# Patient Record
Sex: Male | Born: 1995 | Race: White | Hispanic: No | Marital: Married | Smoking: Never smoker
Health system: Southern US, Community
[De-identification: ages and names within clinical notes are randomized; demographics above are authoritative.]

---

## 2017-06-10 ENCOUNTER — Ambulatory Visit: Payer: 59

## 2017-06-10 ENCOUNTER — Other Ambulatory Visit (HOSPITAL_BASED_OUTPATIENT_CLINIC_OR_DEPARTMENT_OTHER): Payer: Self-pay | Admitting: Family Medicine

## 2017-06-10 ENCOUNTER — Other Ambulatory Visit: Payer: Self-pay | Admitting: Family Medicine

## 2017-06-10 DIAGNOSIS — M79661 Pain in right lower leg: Secondary | ICD-10-CM

## 2019-03-01 IMAGING — US US EXTREM LOW VENOUS*R*
1 series · 13 of 24 positions shown · non-contrast
Comparison: None.

CLINICAL DATA: 22-year-old male with posterior calf pain for the
past 3 days



[Series 1: us extrem low venous*right* · 0.07mm/px · 13 of 31 slices shown]
[im 1/31]
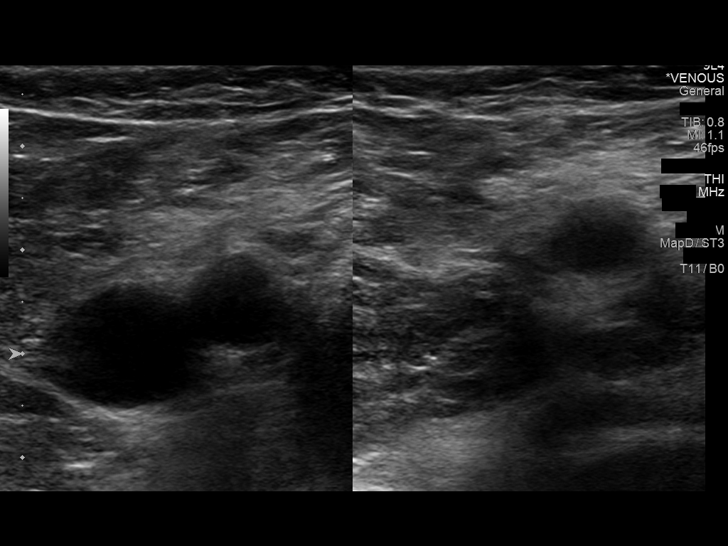
[im 3/31]
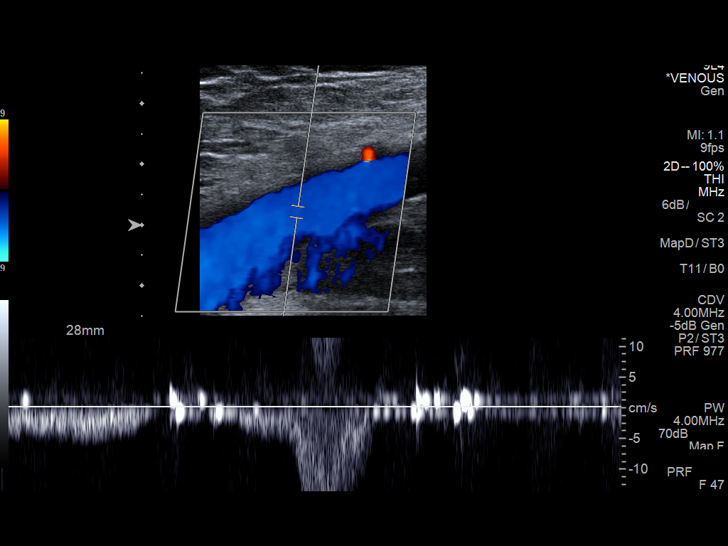
[im 6/31]
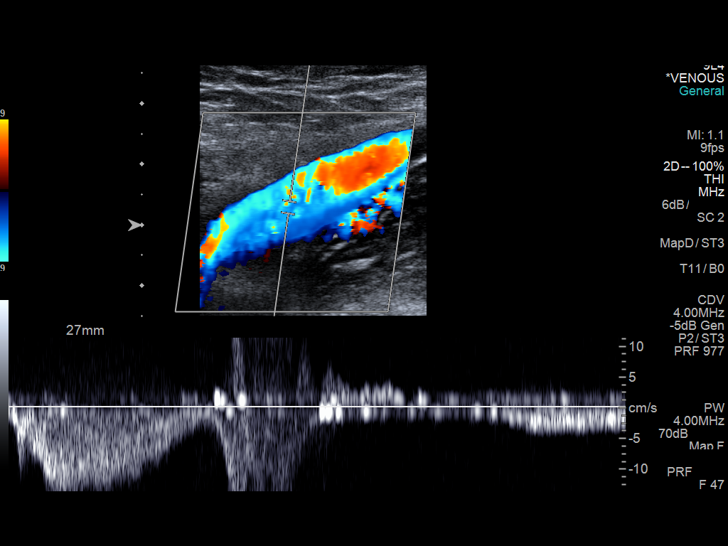
[im 8/31]
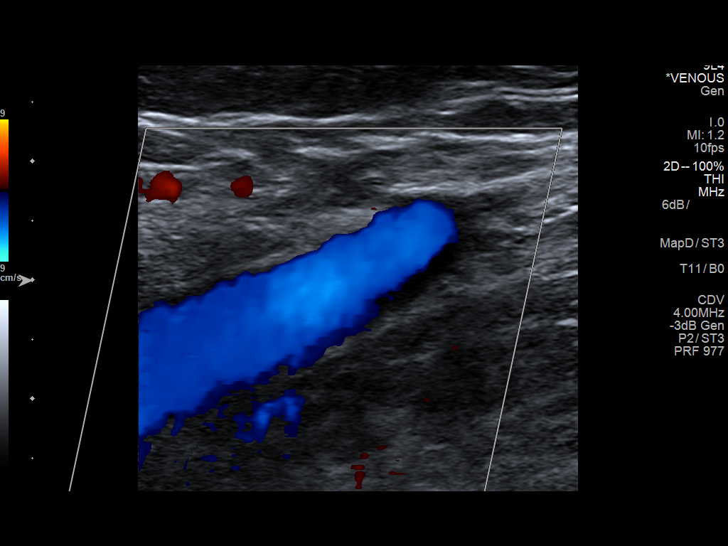
[im 11/31]
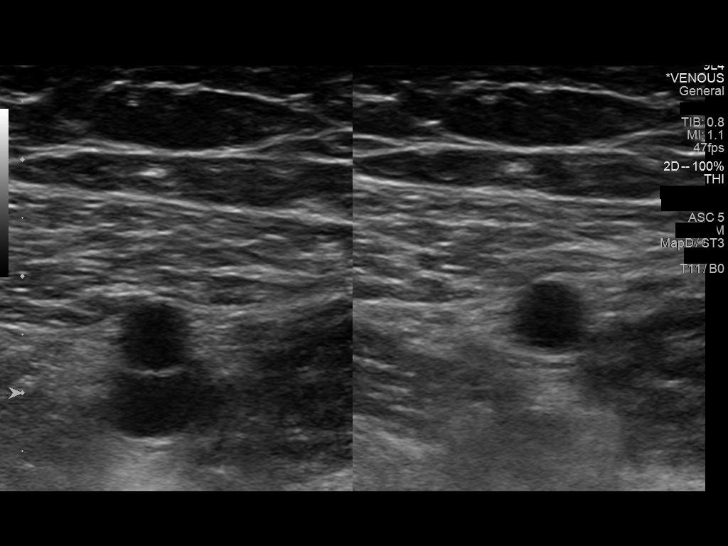
[im 14/31]
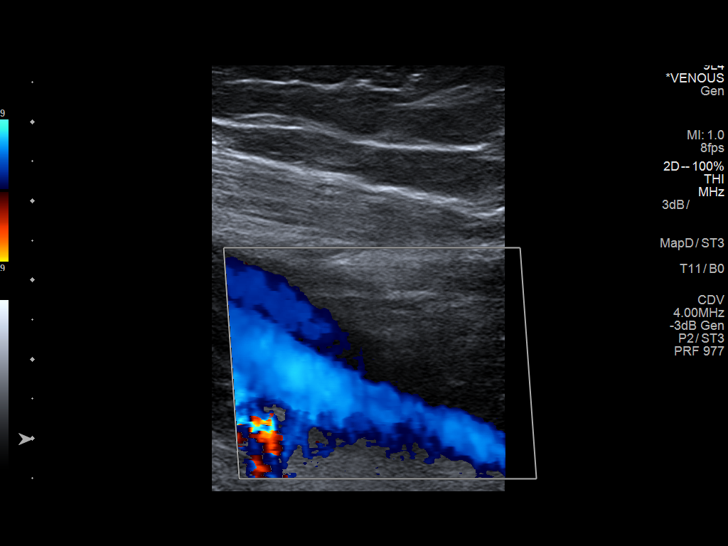
[im 16/31]
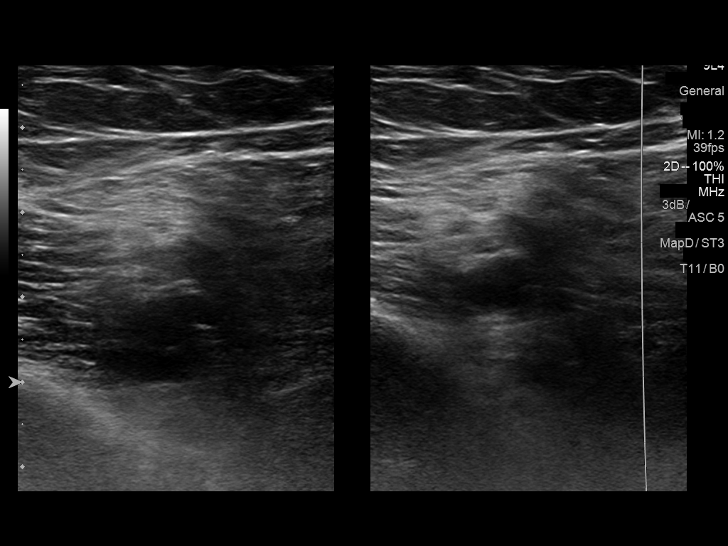
[im 17/31]
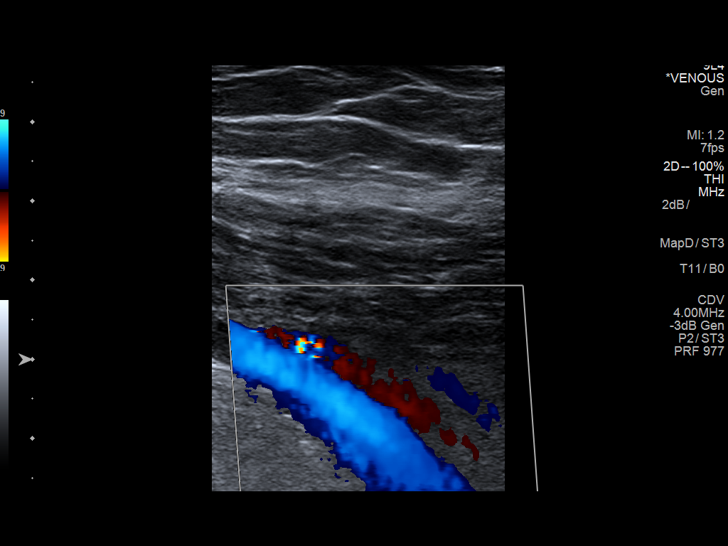
[im 20/31]
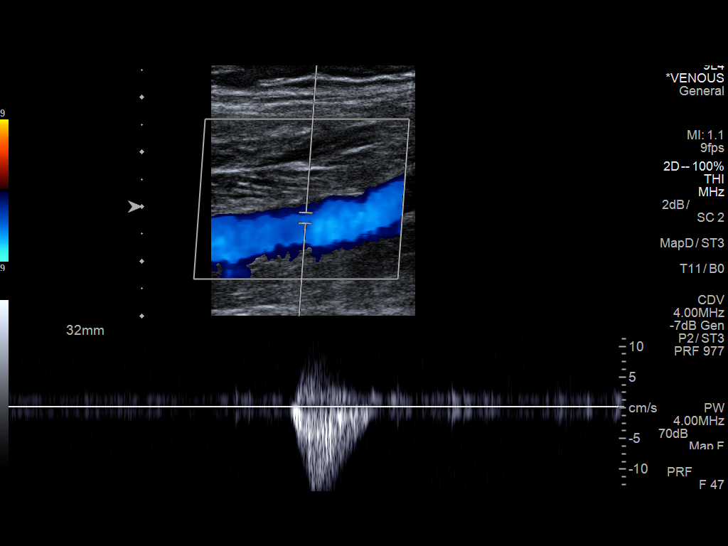
[im 23/31]
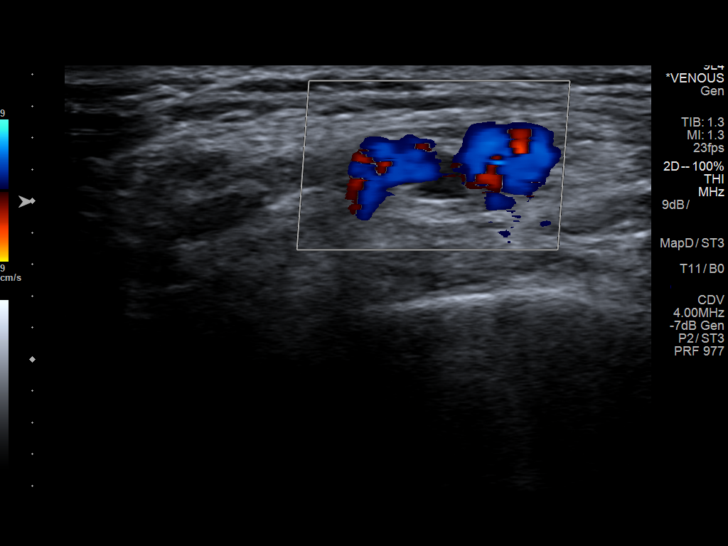
[im 25/31]
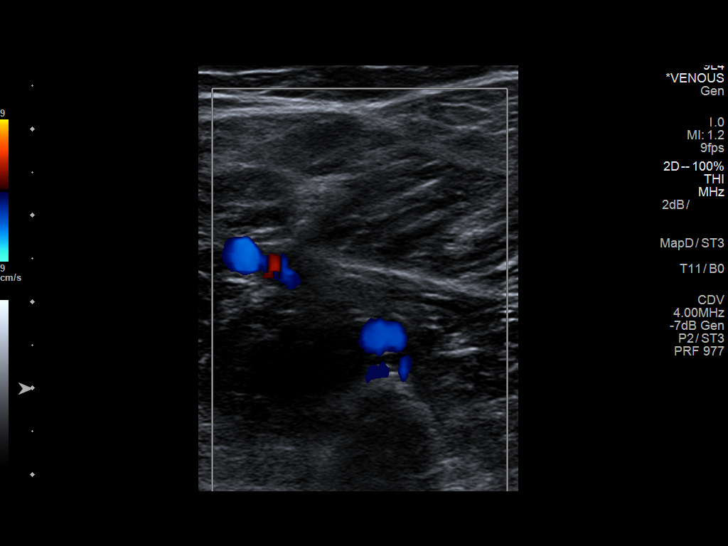
[im 28/31]
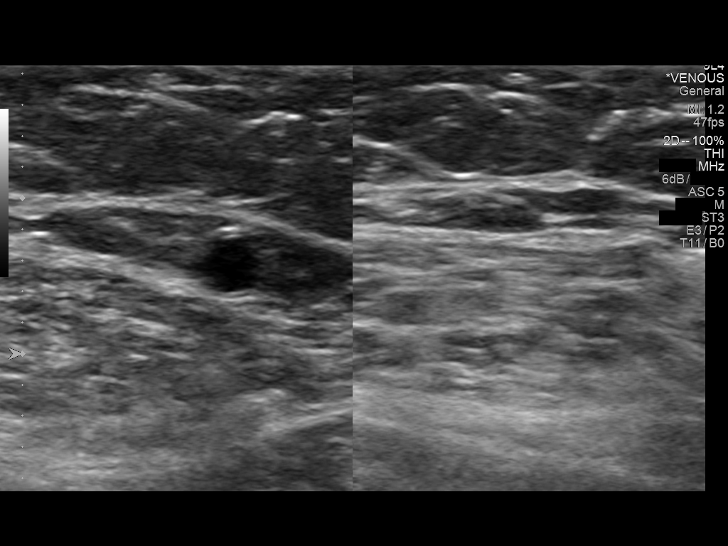
[im 31/31]
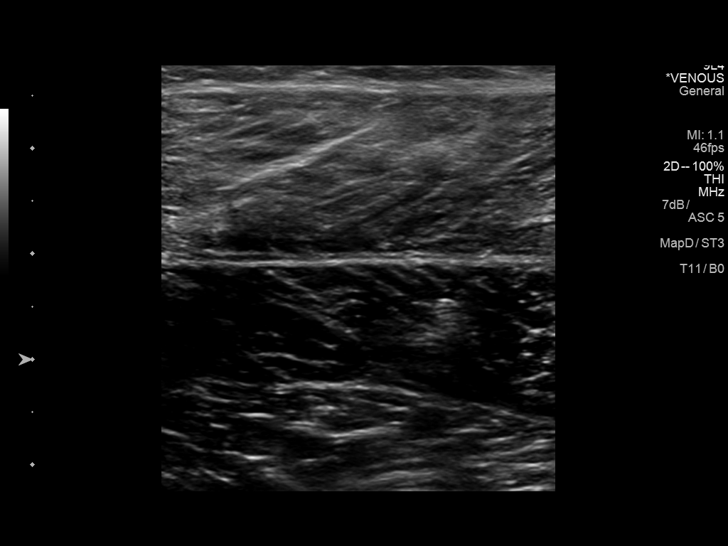

[13 of 24 positions shown; findings below may reference images not displayed]

FINDINGS: Contralateral Common Femoral Vein: Respiratory phasicity is normal
and symmetric with the symptomatic side. No evidence of thrombus.
Normal compressibility.

Common Femoral Vein: No evidence of thrombus. Normal
compressibility, respiratory phasicity and response to augmentation.

Saphenofemoral Junction: No evidence of thrombus. Normal
compressibility and flow on color Doppler imaging.

Profunda Femoral Vein: No evidence of thrombus. Normal
compressibility and flow on color Doppler imaging.

Femoral Vein: No evidence of thrombus. Normal compressibility,
respiratory phasicity and response to augmentation.

Popliteal Vein: No evidence of thrombus. Normal compressibility,
respiratory phasicity and response to augmentation.

Calf Veins: No evidence of thrombus. Normal compressibility and flow
on color Doppler imaging.

Superficial Great Saphenous Vein: No evidence of thrombus. Normal
compressibility.

Venous Reflux:  None.

Other Findings:  None.
IMPRESSION: No evidence of deep venous thrombosis.

## 2022-05-07 ENCOUNTER — Other Ambulatory Visit: Payer: Self-pay

## 2022-05-07 ENCOUNTER — Emergency Department (HOSPITAL_BASED_OUTPATIENT_CLINIC_OR_DEPARTMENT_OTHER)
Admission: EM | Admit: 2022-05-07 | Discharge: 2022-05-07 | Disposition: A | Discharge status: Home / Self Care | Payer: 59 | Attending: Emergency Medicine | Admitting: Emergency Medicine

## 2022-05-07 ENCOUNTER — Encounter (HOSPITAL_BASED_OUTPATIENT_CLINIC_OR_DEPARTMENT_OTHER): Payer: Self-pay | Admitting: Urology

## 2022-05-07 DIAGNOSIS — R1013 Epigastric pain: Secondary | ICD-10-CM | POA: Insufficient documentation

## 2022-05-07 DIAGNOSIS — R112 Nausea with vomiting, unspecified: Secondary | ICD-10-CM | POA: Insufficient documentation

## 2022-05-07 LAB — COMPREHENSIVE METABOLIC PANEL
ALT: 41 U/L (ref 0–44)
AST: 34 U/L (ref 15–41)
Albumin: 4.6 g/dL (ref 3.5–5.0)
Alkaline Phosphatase: 47 U/L (ref 38–126)
Anion gap: 15 (ref 5–15)
BUN: 15 mg/dL (ref 6–20)
CO2: 26 mmol/L (ref 22–32)
Calcium: 9.4 mg/dL (ref 8.9–10.3)
Chloride: 96 mmol/L — ABNORMAL LOW (ref 98–111)
Creatinine, Ser: 0.78 mg/dL (ref 0.61–1.24)
GFR, Estimated: >60 mL/min (ref >60)
Glucose, Bld: 144 mg/dL — ABNORMAL HIGH (ref 70–99)
Potassium: 3.9 mmol/L (ref 3.5–5.1)
Sodium: 137 mmol/L (ref 135–145)
Total Bilirubin: 0.7 mg/dL (ref 0.3–1.2)
Total Protein: 7.1 g/dL (ref 6.5–8.1)

## 2022-05-07 LAB — LIPASE, BLOOD: Lipase: 98 U/L — ABNORMAL HIGH (ref 11–51)

## 2022-05-07 LAB — CBC WITH DIFFERENTIAL/PLATELET
Abs Immature Granulocytes: 0.06 10*3/uL (ref 0.00–0.07)
Basophils Absolute: 0.1 10*3/uL (ref 0.0–0.1)
Basophils Relative: 0 %
Eosinophils Absolute: 0 10*3/uL (ref 0.0–0.5)
Eosinophils Relative: 0 %
HCT: 42.8 % (ref 39.0–52.0)
Hemoglobin: 15.6 g/dL (ref 13.0–17.0)
Immature Granulocytes: 1 %
Lymphocytes Relative: 12 %
Lymphs Abs: 1.5 10*3/uL (ref 0.7–4.0)
MCH: 34.4 pg — ABNORMAL HIGH (ref 26.0–34.0)
MCHC: 36.4 g/dL — ABNORMAL HIGH (ref 30.0–36.0)
MCV: 94.3 fL (ref 80.0–100.0)
Monocytes Absolute: 0.6 10*3/uL (ref 0.1–1.0)
Monocytes Relative: 4 %
Neutro Abs: 10.9 10*3/uL — ABNORMAL HIGH (ref 1.7–7.7)
Neutrophils Relative %: 83 %
Platelets: 263 10*3/uL (ref 150–400)
RBC: 4.54 MIL/uL (ref 4.22–5.81)
RDW: 11.7 % (ref 11.5–15.5)
WBC: 13.1 10*3/uL — ABNORMAL HIGH (ref 4.0–10.5)
nRBC: 0 % (ref 0.0–0.2)

## 2022-05-07 MED ORDER — ONDANSETRON HCL 4 MG/2ML IJ SOLN
4.0000 mg | Freq: Once | INTRAMUSCULAR | Status: AC
  Administered 2022-05-07: 4 mg via INTRAVENOUS
  Filled 2022-05-07: qty 2

## 2022-05-07 MED ORDER — MORPHINE SULFATE (PF) 4 MG/ML IV SOLN
4.0000 mg | Freq: Once | INTRAVENOUS | Status: AC
  Administered 2022-05-07: 4 mg via INTRAVENOUS
  Filled 2022-05-07: qty 1

## 2022-05-07 MED ORDER — ALUM & MAG HYDROXIDE-SIMETH 200-200-20 MG/5ML PO SUSP
30.0000 mL | Freq: Once | ORAL | Status: AC
Start: 2022-05-07 — End: 2022-05-07
  Administered 2022-05-07: 30 mL via ORAL
  Filled 2022-05-07: qty 30

## 2022-05-07 MED ORDER — ONDANSETRON 4 MG PO TBDP: ORAL_TABLET | ORAL | 0 refills | Status: DC

## 2022-05-07 NOTE — Discharge Instructions (Addendum)
Try pepcid or tagamet up to twice a day.  Try to avoid things that may make this worse, most commonly these are spicy foods tomato based products fatty foods chocolate and peppermint.  Alcohol and tobacco can also make this worse.  Return to the emergency department for sudden worsening pain fever or inability to eat or drink.  

## 2022-05-07 NOTE — ED Triage Notes (Signed)
Pt states LUQ pain that started last night and got worse this am  Denies N/V/D now, but did vomit up morning vitamins that he normally takes  Denies fever

## 2022-05-07 NOTE — ED Provider Notes (Signed)
[place] EMERGENCY DEPARTMENT AT [facility] Provider Note   CSN: [id number] Arrival date & time: 05/07/22  1114     History  Chief Complaint  Patient presents with   Abdominal Pain    Marcus Summers is a 27 y.o. male.  27 yo M with a chief complaint of nausea and vomiting.  Said he had some upper abdominal pain after eating something yesterday felt out of the restaurant so did not finish it and then ate the rest of it when he got home and felt worse.  Woke up this morning went to take his vitamins and then ended up throwing up.  No fevers.  No diarrhea.  No known sick contacts.   Abdominal Pain      Home Medications Prior to Admission medications   Medication Sig Start Date End Date Taking? Authorizing Provider  ondansetron (ZOFRAN-ODT) 4 MG disintegrating tablet 4mg  ODT q4 hours prn nausea/vomit 05/07/22  Yes Melene Plan, DO      Allergies    Patient has no known allergies.    Review of Systems   Review of Systems  Gastrointestinal:  Positive for abdominal pain.    Physical Exam Updated Vital Signs BP (!) 142/93 (BP Location: Right Arm)   Pulse 69   Temp 98.2 F (36.8 C) (Oral)   Resp 18   Ht 5\' 10"  (1.778 m)   Wt 88.5 kg   SpO2 99%   BMI 27.98 kg/m  Physical Exam Vitals and nursing note reviewed.  Constitutional:      Appearance: He is well-developed.  HENT:     Head: Normocephalic and atraumatic.  Eyes:     Pupils: Pupils are equal, round, and reactive to light.  Neck:     Vascular: No JVD.  Cardiovascular:     Rate and Rhythm: Normal rate and regular rhythm.     Heart sounds: No murmur heard.    No friction rub. No gallop.  Pulmonary:     Effort: No respiratory distress.     Breath sounds: No wheezing.  Abdominal:     General: There is no distension.     Tenderness: There is abdominal tenderness. There is no guarding or rebound.     Comments: Diffuse upper abdominal discomfort.  Negative Murphy sign.  Musculoskeletal:         General: Normal range of motion.     Cervical back: Normal range of motion and neck supple.  Skin:    Coloration: Skin is not pale.     Findings: No rash.  Neurological:     Mental Status: He is alert and oriented to person, place, and time.  Psychiatric:        Behavior: Behavior normal.     ED Results / Procedures / Treatments   Labs (all labs ordered are listed, but only abnormal results are displayed) Labs Reviewed  CBC WITH DIFFERENTIAL/PLATELET - Abnormal; Notable for the following components:      Result Value   WBC 13.1 (*)    MCH 34.4 (*)    MCHC 36.4 (*)    Neutro Abs 10.9 (*)    All other components within normal limits  COMPREHENSIVE METABOLIC PANEL - Abnormal; Notable for the following components:   Chloride 96 (*)    Glucose, Bld 144 (*)    All other components within normal limits  LIPASE, BLOOD - Abnormal; Notable for the following components:   Lipase 98 (*)    All other components within normal limits  EKG None  Radiology No results found.  Procedures Procedures    Medications Ordered in ED Medications  alum & mag hydroxide-simeth (MAALOX/MYLANTA) 200-200-20 MG/5ML suspension 30 mL (30 mLs Oral Given 05/07/22 1153)  morphine (PF) 4 MG/ML injection 4 mg (4 mg Intravenous Given 05/07/22 1153)  ondansetron (ZOFRAN) injection 4 mg (4 mg Intravenous Given 05/07/22 1153)    ED Course/ Medical Decision Making/ A&P                             Medical Decision Making Amount and/or Complexity of Data Reviewed Labs: ordered.  Risk OTC drugs. Prescription drug management.   27 yo M with a chief complaint of upper abdominal discomfort.  Started while eating dinner last night.  Had an episode of emesis this morning.  Upper abdominal pain for me on exam.  Blood work to assess for pancreatitis hepatitis.  Treatment and nausea.  GI cocktail.  Reassess.  Patient is doing a bit better on repeat assessment.  His LFTs are unremarkable.  His lipase is in an  indeterminate range.  He has mild leukocytosis.  I discussed the results with him.  At this point we will attempt to discharge home.  Will have him follow-up with GI in the office.  1:58 PM:  I have discussed the diagnosis/risks/treatment options with the patient.  Evaluation and diagnostic testing in the emergency department does not suggest an emergent condition requiring admission or immediate intervention beyond what has been performed at this time.  They will follow up with PCP. We also discussed returning to the ED immediately if new or worsening sx occur. We discussed the sx which are most concerning (e.g., sudden worsening pain, fever, inability to tolerate by mouth) that necessitate immediate return. Medications administered to the patient during their visit and any new prescriptions provided to the patient are listed below.  Medications given during this visit Medications  alum & mag hydroxide-simeth (MAALOX/MYLANTA) 200-200-20 MG/5ML suspension 30 mL (30 mLs Oral Given 05/07/22 1153)  morphine (PF) 4 MG/ML injection 4 mg (4 mg Intravenous Given 05/07/22 1153)  ondansetron (ZOFRAN) injection 4 mg (4 mg Intravenous Given 05/07/22 1153)     The patient appears reasonably screen and/or stabilized for discharge and I doubt any other medical condition or other [facility] requiring further screening, evaluation, or treatment in the ED at this time prior to discharge.          Final Clinical Impression(s) / ED Diagnoses Final diagnoses:  Epigastric pain    Rx / DC Orders ED Discharge Orders          Ordered    ondansetron (ZOFRAN-ODT) 4 MG disintegrating tablet        05/07/22 1308              Melene Plan, DO 05/07/22 1358

## 2022-08-13 ENCOUNTER — Emergency Department (HOSPITAL_BASED_OUTPATIENT_CLINIC_OR_DEPARTMENT_OTHER)
Admission: EM | Admit: 2022-08-13 | Discharge: 2022-08-14 | Disposition: A | Discharge status: Home / Self Care | Payer: Self-pay | Attending: Emergency Medicine | Admitting: Emergency Medicine

## 2022-08-13 ENCOUNTER — Emergency Department (HOSPITAL_BASED_OUTPATIENT_CLINIC_OR_DEPARTMENT_OTHER): Payer: Self-pay

## 2022-08-13 ENCOUNTER — Encounter (HOSPITAL_BASED_OUTPATIENT_CLINIC_OR_DEPARTMENT_OTHER): Payer: Self-pay

## 2022-08-13 ENCOUNTER — Other Ambulatory Visit: Payer: Self-pay

## 2022-08-13 DIAGNOSIS — K852 Alcohol induced acute pancreatitis without necrosis or infection: Secondary | ICD-10-CM | POA: Insufficient documentation

## 2022-08-13 DIAGNOSIS — R7989 Other specified abnormal findings of blood chemistry: Secondary | ICD-10-CM | POA: Insufficient documentation

## 2022-08-13 DIAGNOSIS — D72829 Elevated white blood cell count, unspecified: Secondary | ICD-10-CM | POA: Insufficient documentation

## 2022-08-13 LAB — COMPREHENSIVE METABOLIC PANEL
ALT: 34 U/L (ref 0–44)
AST: 24 U/L (ref 15–41)
Albumin: 4.3 g/dL (ref 3.5–5.0)
Alkaline Phosphatase: 52 U/L (ref 38–126)
Anion gap: 14 (ref 5–15)
BUN: 10 mg/dL (ref 6–20)
CO2: 24 mmol/L (ref 22–32)
Calcium: 9.1 mg/dL (ref 8.9–10.3)
Chloride: 95 mmol/L — ABNORMAL LOW (ref 98–111)
Creatinine, Ser: 0.82 mg/dL (ref 0.61–1.24)
GFR, Estimated: >60 mL/min (ref >60)
Glucose, Bld: 144 mg/dL — ABNORMAL HIGH (ref 70–99)
Potassium: 3.5 mmol/L (ref 3.5–5.1)
Sodium: 133 mmol/L — ABNORMAL LOW (ref 135–145)
Total Bilirubin: 1.6 mg/dL — ABNORMAL HIGH (ref 0.3–1.2)
Total Protein: 8 g/dL (ref 6.5–8.1)

## 2022-08-13 LAB — LIPASE, BLOOD: Lipase: 162 U/L — ABNORMAL HIGH (ref 11–51)

## 2022-08-13 LAB — URINALYSIS, ROUTINE W REFLEX MICROSCOPIC
Bilirubin Urine: NEGATIVE
Glucose, UA: NEGATIVE mg/dL
Ketones, ur: NEGATIVE mg/dL
Leukocytes,Ua: NEGATIVE
Nitrite: POSITIVE — AB
Protein, ur: 100 mg/dL — AB
Specific Gravity, Urine: >=1.03 (ref 1.005–1.030)
pH: 6 (ref 5.0–8.0)

## 2022-08-13 LAB — CBC
HCT: 48.5 % (ref 39.0–52.0)
Hemoglobin: 17.5 g/dL — ABNORMAL HIGH (ref 13.0–17.0)
MCH: 33.6 pg (ref 26.0–34.0)
MCHC: 36.1 g/dL — ABNORMAL HIGH (ref 30.0–36.0)
MCV: 93.1 fL (ref 80.0–100.0)
Platelets: 261 10*3/uL (ref 150–400)
RBC: 5.21 MIL/uL (ref 4.22–5.81)
RDW: 11.9 % (ref 11.5–15.5)
WBC: 19 10*3/uL — ABNORMAL HIGH (ref 4.0–10.5)
nRBC: 0 % (ref 0.0–0.2)

## 2022-08-13 LAB — URINALYSIS, MICROSCOPIC (REFLEX)

## 2022-08-13 MED ORDER — SODIUM CHLORIDE 0.9 % IV SOLN
2.0000 g | Freq: Once | INTRAVENOUS | Status: AC
  Administered 2022-08-13: 2 g via INTRAVENOUS
  Filled 2022-08-13: qty 20

## 2022-08-13 MED ORDER — IBUPROFEN 800 MG PO TABS
800.0000 mg | ORAL_TABLET | Freq: Once | ORAL | Status: AC
  Administered 2022-08-13: 800 mg via ORAL
  Filled 2022-08-13: qty 1

## 2022-08-13 MED ORDER — MORPHINE SULFATE (PF) 2 MG/ML IV SOLN
2.0000 mg | Freq: Once | INTRAVENOUS | Status: AC
  Administered 2022-08-13: 2 mg via INTRAVENOUS
  Filled 2022-08-13: qty 1

## 2022-08-13 MED ORDER — KETOROLAC TROMETHAMINE 15 MG/ML IJ SOLN: 30.0000 mg | Freq: Once | INTRAMUSCULAR | Status: DC

## 2022-08-13 MED ORDER — SODIUM CHLORIDE 0.9 % IV BOLUS
1000.0000 mL | Freq: Once | INTRAVENOUS | Status: AC
  Administered 2022-08-13: 1000 mL via INTRAVENOUS

## 2022-08-13 MED ORDER — IOHEXOL 300 MG/ML  SOLN
100.0000 mL | Freq: Once | INTRAMUSCULAR | Status: AC | PRN
  Administered 2022-08-13: 100 mL via INTRAVENOUS

## 2022-08-13 NOTE — ED Provider Notes (Signed)
[facility]PT [facility] Emergency Department Provider Note MRN:  [id number]  Arrival date & time: 08/14/22     Chief Complaint   Abdominal Pain   History of Present Illness   Marcus Summers is a 27 y.o. year-old male with no pertinent past medical history presenting to the ED with chief complaint of abdominal pain.  Left lower quadrant, left flank pain for the past 24 hours.  Associated with fever.  Unable to get a comfortable position.  Pain seems to have started after a week of binge drinking while he was on vacation.  No dysuria.  Review of Systems  A thorough review of systems was obtained and all systems are negative except as noted in the HPI and PMH.   Patient's Health History   History reviewed. No pertinent past medical history.  History reviewed. No pertinent surgical history.  History reviewed. No pertinent family history.  Social History   Socioeconomic History   Marital status: Married    Spouse name: Not on file   Number of children: Not on file   Years of education: Not on file   Highest education level: Not on file  Occupational History   Not on file  Tobacco Use   Smoking status: Never   Smokeless tobacco: Never  Substance and Sexual Activity   Alcohol use: Yes    Comment: occ, last on satuday   Drug use: Never   Sexual activity: Not on file  Other Topics Concern   Not on file  Social History Narrative   Not on file   Social Determinants of Health   Financial Resource Strain: Not on file  Food Insecurity: Not on file  Transportation Needs: Not on file  Physical Activity: Not on file  Stress: Not on file  Social Connections: Not on file  Intimate Partner Violence: Not on file     Physical Exam   Vitals:   08/13/22 2330 08/13/22 2331  BP: (!) 151/102   Pulse: (!) 103   Resp: 17 17  Temp:    SpO2: 96%     CONSTITUTIONAL: Well-appearing, NAD NEURO/PSYCH:  Alert and oriented x 3, no focal deficits EYES:  eyes equal and  reactive ENT/NECK:  no LAD, no JVD CARDIO: Regular rate, well-perfused, normal S1 and S2 PULM:  CTAB no wheezing or rhonchi GI/GU:  non-distended, left lower quadrant tenderness to palpation MSK/SPINE:  No gross deformities, no edema SKIN:  no rash, atraumatic   *Additional and/or pertinent findings included in MDM below  Diagnostic and Interventional Summary    EKG Interpretation Date/Time:    Ventricular Rate:    PR Interval:    QRS Duration:    QT Interval:    QTC Calculation:   R Axis:      Text Interpretation:         Labs Reviewed  LIPASE, BLOOD - Abnormal; Notable for the following components:      Result Value   Lipase 162 (*)    All other components within normal limits  COMPREHENSIVE METABOLIC PANEL - Abnormal; Notable for the following components:   Sodium 133 (*)    Chloride 95 (*)    Glucose, Bld 144 (*)    Total Bilirubin 1.6 (*)    All other components within normal limits  CBC - Abnormal; Notable for the following components:   WBC 19.0 (*)    Hemoglobin 17.5 (*)    MCHC 36.1 (*)    All other components within normal limits  URINALYSIS, ROUTINE W REFLEX MICROSCOPIC - Abnormal; Notable for the following components:   Color, Urine AMBER (*)    Hgb urine dipstick TRACE (*)    Protein, ur 100 (*)    Nitrite POSITIVE (*)    All other components within normal limits  URINALYSIS, MICROSCOPIC (REFLEX) - Abnormal; Notable for the following components:   Bacteria, UA FEW (*)    All other components within normal limits    CT ABDOMEN PELVIS W CONTRAST  Final Result      Medications  ibuprofen (ADVIL) tablet 800 mg (800 mg Oral Given 08/13/22 2221)  iohexol (OMNIPAQUE) 300 MG/ML solution 100 mL (100 mLs Intravenous Contrast Given 08/13/22 2259)  morphine (PF) 2 MG/ML injection 2 mg (2 mg Intravenous Given 08/13/22 2326)  cefTRIAXone (ROCEPHIN) 2 g in sodium chloride 0.9 % 100 mL IVPB (2 g Intravenous New Bag/Given 08/13/22 2329)  sodium chloride 0.9 %  bolus 1,000 mL (1,000 mLs Intravenous New Bag/Given 08/13/22 2326)  morphine (PF) 2 MG/ML injection 2 mg (2 mg Intravenous Given 08/14/22 0021)     Procedures  /  Critical Care Procedures  ED Course and Medical Decision Making  Initial Impression and Ddx Patient arrives febrile and tachycardic with focal abdominal pain in the left lower quadrant.  Differential diagnosis includes diverticulitis, colitis, pyelonephritis, kidney stone.  With recent binge drinking and also considering gastric ulcer with perforation alcoholic pancreatitis.  Past medical/surgical history that increases complexity of ED encounter: None  Interpretation of Diagnostics I personally reviewed the laboratory assessment and my interpretation is as follows: Leukocytosis, elevated lipase, hemoconcentration noted, nitrate positive urine.  CT confirms acute uncomplicated pancreatitis.  Patient Reassessment and Ultimate Disposition/Management     Patient feeling a lot better.  Vital signs are normal, no acute distress, soft abdomen with no rebound guarding or rigidity.  Discussed management options of his pancreatitis, admission offered, patient comfortable going home with dietary restriction, symptomatic control.  Strict return precautions.  Question concomitant UTI versus fever due to the pancreatitis.  Will cover with Keflex.  Patient management required discussion with the following services or consulting groups:  None  Complexity of Problems Addressed Acute illness or injury that poses threat of life of bodily function  Additional Data Reviewed and Analyzed Further history obtained from: None  Additional Factors Impacting ED Encounter Risk Prescriptions, Use of parenteral controlled substances, and Consideration of hospitalization  Elmer Sow. Pilar Plate, MD [facility] Emergency Medicine [facility] [email or web address]  Final Clinical Impressions(s) / ED Diagnoses     ICD-10-CM   1.  Alcohol-induced acute pancreatitis, unspecified complication status  K85.20       ED Discharge Orders          Ordered    ondansetron (ZOFRAN-ODT) 4 MG disintegrating tablet  Every 8 hours PRN        08/14/22 0024    naproxen (NAPROSYN) 500 MG tablet  2 times daily        08/14/22 0024    oxyCODONE (ROXICODONE) 5 MG immediate release tablet  Every 4 hours PRN        08/14/22 0024    cephALEXin (KEFLEX) 500 MG capsule  3 times daily        08/14/22 0024             Discharge Instructions Discussed with and Provided to Patient:     Discharge Instructions      You were evaluated in the Emergency Department and after careful evaluation, we did  not find any emergent condition requiring admission or further testing in the hospital.  Your exam/testing today is overall reassuring.  Symptoms seem to be due to pancreatitis.  Take the Keflex antibiotic as directed.  Use the Zofran as needed for nausea.  Use the Naprosyn twice daily for pain.  Use the oxycodone as needed for more significant pain.  Will be important to restrict your diet to clear liquids for the next 2 days and after that slowly advance your diet as we discussed.  Plenty of fluids.  Please return to the Emergency Department if you experience any worsening of your condition.   Thank you for allowing [place] to be a part of your care.       Sabas Sous, MD 08/14/22 [phone]

## 2022-08-13 NOTE — ED Triage Notes (Signed)
Pt LLQ pain that started Sunday morning. Pt denies N/V/D.

## 2022-08-14 MED ORDER — ONDANSETRON 4 MG PO TBDP: 4.0000 mg | ORAL_TABLET | Freq: Three times a day (TID) | ORAL | 0 refills | Status: AC | PRN

## 2022-08-14 MED ORDER — OXYCODONE HCL 5 MG PO TABS: 5.0000 mg | ORAL_TABLET | ORAL | 0 refills | Status: AC | PRN

## 2022-08-14 MED ORDER — NAPROXEN 500 MG PO TABS: 500.0000 mg | ORAL_TABLET | Freq: Two times a day (BID) | ORAL | 0 refills | Status: AC

## 2022-08-14 MED ORDER — CEPHALEXIN 500 MG PO CAPS: 500.0000 mg | ORAL_CAPSULE | Freq: Three times a day (TID) | ORAL | 0 refills | Status: AC

## 2022-08-14 MED ORDER — MORPHINE SULFATE (PF) 2 MG/ML IV SOLN
2.0000 mg | Freq: Once | INTRAVENOUS | Status: AC
  Administered 2022-08-14: 2 mg via INTRAVENOUS
  Filled 2022-08-14: qty 1

## 2022-08-14 NOTE — Discharge Instructions (Signed)
You were evaluated in the Emergency Department and after careful evaluation, we did not find any emergent condition requiring admission or further testing in the hospital.  Your exam/testing today is overall reassuring.  Symptoms seem to be due to pancreatitis.  Take the Keflex antibiotic as directed.  Use the Zofran as needed for nausea.  Use the Naprosyn twice daily for pain.  Use the oxycodone as needed for more significant pain.  Will be important to restrict your diet to clear liquids for the next 2 days and after that slowly advance your diet as we discussed.  Plenty of fluids.  Please return to the Emergency Department if you experience any worsening of your condition.   Thank you for allowing [place] to be a part of your care.

## 2022-12-14 ENCOUNTER — Emergency Department (HOSPITAL_BASED_OUTPATIENT_CLINIC_OR_DEPARTMENT_OTHER)
Admission: EM | Admit: 2022-12-14 | Discharge: 2022-12-15 | Disposition: A | Discharge status: Home / Self Care | Payer: Self-pay | Attending: Emergency Medicine | Admitting: Emergency Medicine

## 2022-12-14 ENCOUNTER — Other Ambulatory Visit: Payer: Self-pay

## 2022-12-14 ENCOUNTER — Emergency Department (HOSPITAL_BASED_OUTPATIENT_CLINIC_OR_DEPARTMENT_OTHER): Payer: Self-pay

## 2022-12-14 ENCOUNTER — Encounter (HOSPITAL_BASED_OUTPATIENT_CLINIC_OR_DEPARTMENT_OTHER): Payer: Self-pay

## 2022-12-14 DIAGNOSIS — R809 Proteinuria, unspecified: Secondary | ICD-10-CM | POA: Insufficient documentation

## 2022-12-14 DIAGNOSIS — R55 Syncope and collapse: Secondary | ICD-10-CM | POA: Insufficient documentation

## 2022-12-14 LAB — BASIC METABOLIC PANEL
Anion gap: 12 (ref 5–15)
BUN: 15 mg/dL (ref 6–20)
CO2: 25 mmol/L (ref 22–32)
Calcium: 9.5 mg/dL (ref 8.9–10.3)
Chloride: 103 mmol/L (ref 98–111)
Creatinine, Ser: 0.91 mg/dL (ref 0.61–1.24)
GFR, Estimated: >60 mL/min (ref >60)
Glucose, Bld: 108 mg/dL — ABNORMAL HIGH (ref 70–99)
Potassium: 3.5 mmol/L (ref 3.5–5.1)
Sodium: 140 mmol/L (ref 135–145)

## 2022-12-14 LAB — CBC
HCT: 43.6 % (ref 39.0–52.0)
Hemoglobin: 15.6 g/dL (ref 13.0–17.0)
MCH: 33.8 pg (ref 26.0–34.0)
MCHC: 35.8 g/dL (ref 30.0–36.0)
MCV: 94.4 fL (ref 80.0–100.0)
Platelets: 253 10*3/uL (ref 150–400)
RBC: 4.62 MIL/uL (ref 4.22–5.81)
RDW: 12.4 % (ref 11.5–15.5)
WBC: 10.5 10*3/uL (ref 4.0–10.5)
nRBC: 0 % (ref 0.0–0.2)

## 2022-12-14 LAB — URINALYSIS, ROUTINE W REFLEX MICROSCOPIC
Bilirubin Urine: NEGATIVE
Glucose, UA: NEGATIVE mg/dL
Hgb urine dipstick: NEGATIVE
Ketones, ur: NEGATIVE mg/dL
Leukocytes,Ua: NEGATIVE
Nitrite: NEGATIVE
Protein, ur: >=300 mg/dL — AB
Specific Gravity, Urine: >=1.03 (ref 1.005–1.030)
pH: 7 (ref 5.0–8.0)

## 2022-12-14 LAB — URINALYSIS, MICROSCOPIC (REFLEX)

## 2022-12-14 LAB — CBG MONITORING, ED: Glucose-Capillary: 139 mg/dL — ABNORMAL HIGH (ref 70–99)

## 2022-12-14 NOTE — ED Provider Triage Note (Signed)
Emergency Medicine Provider Triage Evaluation Note  Marcus Summers , a 27 y.o. male  was evaluated in triage.  Pt complains of seizure-like activity. He states that he was at the barber earlier today when he passed out in the chair for several seconds. He was told that he was shaking all over.   Review of Systems  Positive: LOC Negative: Alcohol or drug use, head injury  Physical Exam  BP (!) 152/84 (BP Location: Left Arm)   Pulse 97   Temp 98.7 F (37.1 C)   Resp 18   Ht 5\' 10"  (1.778 m)   Wt 90 kg   SpO2 100%   BMI 28.47 kg/m  Gen:   Awake, no distress   Resp:  Normal effort  MSK:   Moves extremities without difficulty  Other:    Medical Decision Making  Medically screening exam initiated at 10:25 PM.  Appropriate orders placed.  Marcus Summers was informed that the remainder of the evaluation will be completed by another provider, this initial triage assessment does not replace that evaluation, and the importance of remaining in the ED until their evaluation is complete.   Maxwell Marion, PA-C 12/14/22 2227

## 2022-12-14 NOTE — ED Triage Notes (Signed)
The patient had a syncopal episode while getting his hair cut. He did not fall. He denied any further symptoms.

## 2022-12-15 NOTE — ED Notes (Signed)
Discharge instructions regarding vasovagal were reviewed. Pt was instructed to follow up with [facility] and to return with any new or worsening symptoms

## 2022-12-15 NOTE — ED Notes (Signed)
Initial contact made. Pt is resting in bed. Pt states that he had a syncopal episode earlier today, states he currently feels alright. Denies pain

## 2022-12-15 NOTE — ED Provider Notes (Signed)
[place] [facility] Provider Note   CSN: [id number] Arrival date & time: 12/14/22  4098     History  Chief Complaint  Patient presents with   Loss of Consciousness    Marcus Summers is a 27 y.o. male.  The history is provided by the patient.  Loss of Consciousness Episode history:  Single Most recent episode:  Today Timing:  Constant Progression:  Resolved Chronicity:  New Context comment:  Sitting in a hot barber chair Relieved by:  Nothing Worsened by:  Nothing Ineffective treatments:  None tried Associated symptoms: no anxiety, no chest pain, no confusion, no diaphoresis, no difficulty breathing, no dizziness, no fever, no focal sensory loss, no focal weakness, no headaches, no malaise/fatigue, no nausea, no palpitations, no recent fall, no recent injury, no recent surgery, no rectal bleeding, no seizures, no shortness of breath, no visual change, no vomiting and no weakness   Risk factors: no congenital heart disease        Home Medications Prior to Admission medications   Medication Sig Start Date End Date Taking? Authorizing Provider  naproxen (NAPROSYN) 500 MG tablet Take 1 tablet (500 mg total) by mouth 2 (two) times daily. 08/14/22   Sabas Sous, MD  ondansetron (ZOFRAN-ODT) 4 MG disintegrating tablet Take 1 tablet (4 mg total) by mouth every 8 (eight) hours as needed for nausea or vomiting. 08/14/22   Sabas Sous, MD  oxyCODONE (ROXICODONE) 5 MG immediate release tablet Take 1 tablet (5 mg total) by mouth every 4 (four) hours as needed for severe pain. 08/14/22   Sabas Sous, MD      Allergies    Patient has no known allergies.    Review of Systems   Review of Systems  Constitutional:  Negative for diaphoresis, fever and malaise/fatigue.  HENT:  Negative for facial swelling.   Eyes:  Negative for redness.  Respiratory:  Negative for shortness of breath.   Cardiovascular:  Positive for syncope. Negative for  chest pain and palpitations.  Gastrointestinal:  Negative for abdominal pain, nausea and vomiting.  Neurological:  Negative for dizziness, focal weakness, seizures, weakness and headaches.  Psychiatric/Behavioral:  Negative for confusion.   All other systems reviewed and are negative.   Physical Exam Updated Vital Signs BP (!) 140/70   Pulse 68   Temp 98.6 F (37 C) (Oral)   Resp 16   Ht 5\' 10"  (1.778 m)   Wt 90 kg   SpO2 100%   BMI 28.47 kg/m  Physical Exam Vitals and nursing note reviewed.  Constitutional:      General: He is not in acute distress.    Appearance: He is well-developed. He is not diaphoretic.  HENT:     Head: Normocephalic and atraumatic.     Nose: Nose normal.  Eyes:     Extraocular Movements: Extraocular movements intact.     Conjunctiva/sclera: Conjunctivae normal.     Pupils: Pupils are equal, round, and reactive to light.  Cardiovascular:     Rate and Rhythm: Normal rate and regular rhythm.     Pulses: Normal pulses.     Heart sounds: Normal heart sounds.  Pulmonary:     Effort: Pulmonary effort is normal.     Breath sounds: Normal breath sounds. No wheezing or rales.  Abdominal:     General: Bowel sounds are normal.     Palpations: Abdomen is soft.     Tenderness: There is no abdominal tenderness. There is  no guarding or rebound.  Musculoskeletal:        General: Normal range of motion.     Cervical back: Normal range of motion and neck supple.  Skin:    General: Skin is warm and dry.     Capillary Refill: Capillary refill takes less than 2 seconds.  Neurological:     General: No focal deficit present.     Mental Status: He is alert and oriented to person, place, and time.     Deep Tendon Reflexes: Reflexes normal.  Psychiatric:        Mood and Affect: Mood normal.     ED Results / Procedures / Treatments   Labs (all labs ordered are listed, but only abnormal results are displayed) Results for orders placed or performed during the  hospital encounter of 12/14/22  CBG monitoring, ED   Collection Time: 12/14/22  6:36 PM  Result Value Ref Range   Glucose-Capillary 139 (H) 70 - 99 mg/dL   Comment 1 Notify RN   Basic metabolic panel   Collection Time: 12/14/22  6:41 PM  Result Value Ref Range   Sodium 140 135 - 145 mmol/L   Potassium 3.5 3.5 - 5.1 mmol/L   Chloride 103 98 - 111 mmol/L   CO2 25 22 - 32 mmol/L   Glucose, Bld 108 (H) 70 - 99 mg/dL   BUN 15 6 - 20 mg/dL   Creatinine, Ser 7.82 0.61 - 1.24 mg/dL   Calcium 9.5 8.9 - 95.6 mg/dL   GFR, Estimated >21 >30 mL/min   Anion gap 12 5 - 15  CBC   Collection Time: 12/14/22  6:41 PM  Result Value Ref Range   WBC 10.5 4.0 - 10.5 K/uL   RBC 4.62 4.22 - 5.81 MIL/uL   Hemoglobin 15.6 13.0 - 17.0 g/dL   HCT 86.5 78.4 - 69.6 %   MCV 94.4 80.0 - 100.0 fL   MCH 33.8 26.0 - 34.0 pg   MCHC 35.8 30.0 - 36.0 g/dL   RDW 29.5 28.4 - 13.2 %   Platelets 253 150 - 400 K/uL   nRBC 0.0 0.0 - 0.2 %  Urinalysis, Routine w reflex microscopic -Urine, Clean Catch   Collection Time: 12/14/22  6:41 PM  Result Value Ref Range   Color, Urine YELLOW YELLOW   APPearance CLEAR CLEAR   Specific Gravity, Urine >=1.030 1.005 - 1.030   pH 7.0 5.0 - 8.0   Glucose, UA NEGATIVE NEGATIVE mg/dL   Hgb urine dipstick NEGATIVE NEGATIVE   Bilirubin Urine NEGATIVE NEGATIVE   Ketones, ur NEGATIVE NEGATIVE mg/dL   Protein, ur >=440 (A) NEGATIVE mg/dL   Nitrite NEGATIVE NEGATIVE   Leukocytes,Ua NEGATIVE NEGATIVE  Urinalysis, Microscopic (reflex)   Collection Time: 12/14/22  6:41 PM  Result Value Ref Range   RBC / HPF 0-5 0 - 5 RBC/hpf   WBC, UA 6-10 0 - 5 WBC/hpf   Bacteria, UA FEW (A) NONE SEEN   Squamous Epithelial / HPF 0-5 0 - 5 /HPF   Mucus PRESENT    CT Head Wo Contrast Result Date: 12/14/2022 CLINICAL DATA:  New onset seizure EXAM: CT HEAD WITHOUT CONTRAST TECHNIQUE: Contiguous axial images were obtained from the base of the skull through the vertex without intravenous contrast.  RADIATION DOSE REDUCTION: This exam was performed according to the departmental dose-optimization program which includes automated exposure control, adjustment of the mA and/or kV according to patient size and/or use of iterative reconstruction technique. COMPARISON:  None Available.  FINDINGS: Brain: No evidence of acute infarction, hemorrhage, hydrocephalus, extra-axial collection or mass lesion/mass effect. Vascular: No hyperdense vessel or unexpected calcification. Skull: Normal. Negative for fracture or focal lesion. Sinuses/Orbits: No acute finding. Other: None. IMPRESSION: No acute intracranial abnormality. Electronically Signed   By: Darliss Cheney M.D.   On: 12/14/2022 23:30     EKG NSR with LVH on EKG  Radiology CT Head Wo Contrast Result Date: 12/14/2022 CLINICAL DATA:  New onset seizure EXAM: CT HEAD WITHOUT CONTRAST TECHNIQUE: Contiguous axial images were obtained from the base of the skull through the vertex without intravenous contrast. RADIATION DOSE REDUCTION: This exam was performed according to the departmental dose-optimization program which includes automated exposure control, adjustment of the mA and/or kV according to patient size and/or use of iterative reconstruction technique. COMPARISON:  None Available. FINDINGS: Brain: No evidence of acute infarction, hemorrhage, hydrocephalus, extra-axial collection or mass lesion/mass effect. Vascular: No hyperdense vessel or unexpected calcification. Skull: Normal. Negative for fracture or focal lesion. Sinuses/Orbits: No acute finding. Other: None. IMPRESSION: No acute intracranial abnormality. Electronically Signed   By: Darliss Cheney M.D.   On: 12/14/2022 23:30    Procedures Procedures    Medications Ordered in ED Medications - No data to display  ED Course/ Medical Decision Making/ A&P                                 Medical Decision Making Patient who was overcome with heat and had syncopal event in chair   Amount and/or  Complexity of Data Reviewed External Data Reviewed: notes.    Details: Previous notes reviewed  Labs: ordered.    Details: Normal sodium 140, normal potassium 3.5, normal creatinine 0.91, proteinuria on urine.  Normal white count 10.5, normal hemoglobin 15.6, normal platelets  Radiology: ordered and independent interpretation performed.    Details: Normal CT by me  ECG/medicine tests: ordered and independent interpretation performed. Decision-making details documented in ED Course.  Risk Risk Details: Suspect patient vagaled due to heat.  Well appearing.  Informed that he has proteinuria so no energy drinks drink copious water and instructed to follow up with kidney specialists.  This was printed on discharge as well.  Well appearing, normal exam and vitals. Stable for discharge with close follow up.  Strict returns  Orthostatic VS for the past 72 hrs (Last 3 readings):  BP Location  12/15/22 0002 Right Arm  12/14/22 1827 Left Arm     Final Clinical Impression(s) / ED Diagnoses Final diagnoses:  Vasovagal episode  Proteinuria, unspecified type   Return for intractable cough, coughing up blood, fevers > 100.4 unrelieved by medication, shortness of breath, intractable vomiting, chest pain, shortness of breath, weakness, numbness, changes in speech, facial asymmetry, abdominal pain, passing out, Inability to tolerate liquids or food, cough, altered mental status or any concerns. No signs of systemic illness or infection. The patient is nontoxic-appearing on exam and vital signs are within normal limits.  I have reviewed the triage vital signs and the nursing notes. Pertinent labs & imaging results that were available during my care of the patient were reviewed by me and considered in my medical decision making (see chart for details). After history, exam, and medical workup I feel the patient has been appropriately medically screened and is safe for discharge home. Pertinent diagnoses were  discussed with the patient. Patient was given return precautions.    Rx / DC Orders ED  Discharge Orders     None         Breniya Goertzen, MD 12/15/22 4782
